# Patient Record
Sex: Male | Born: 1995
Health system: Southern US, Community
[De-identification: ages and names within clinical notes are randomized; demographics above are authoritative.]

## PROBLEM LIST (undated history)

## (undated) HISTORY — PX: OTHER SURGICAL HISTORY: SHX169

---
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
## 2017-01-02 NOTE — Progress Notes (Signed)
[place]  Telephone:(336) [phone] Fax:(336) [place] consult Note   Patient Care Team: Aura Dials, MD as PCP - General (Family Medicine) Jacelyn Pi, MD as Consulting Physician (Endocrinology) Truitt Merle, MD as Consulting Physician (Hematology) 01/03/2017  Referral physician: Dr. Chalmers Cater   CHIEF COMPLAINTS/PURPOSE OF CONSULTATION:  Polycythemia   HISTORY OF PRESENTING ILLNESS: 01/03/17 Cory Smith 21 y.o. male is here because of polycythemia. He was referred by Endocrinologist, Dr. Jacelyn Pi. He presents to the clinic today accompanied by his mother.   In the past, he has been having borderline mild hypothyroidism and is not on medication. He was diagnosed with Vitamin D Deficiency.His mother has hypothyroidism and it run in her family. He was born full term.   Today he notes he had a blood test about a year ago which shows he had high blood count by PCP Dr. Sheryn Bison. The most recent labs done by Dr.Balan in 10/2016. He had not had blood tests prior to being 20. He is now in college at [employer]. He notes he has felt tired since high school and does not have energy to do sports. He is able to do day to day activities. He will feel out of breast when going up and down stairs. His mother notes he has tachycardia and will see Cardiologist tomorrow. He may occasionally take protein supplement and take vitamin D. He thinks he has dry scalp and will itch in just his scalp. He notes he will wake up with mucous build up and will have to blow his nose every morning. He notes his weight is consistent lower. He denies fever or night sweats. He goes to the gym once a week and does cardio. His insurance is under his father. He has never donated blood before.   He was found to have abnormal CBC from 12/12/16 labs show polycythemia, RBC 5.78, Hgb: 17.5, HCT 51.8% He denies recent chest pain on exertion, shortness of breath on minimal exertion, pre-syncopal episodes,  or palpitations. He had not noticed any recent bleeding such as epistaxis, hematuria or hematochezia The patient denies over the counter NSAID ingestion. He is not on antiplatelets agents. He had no prior history or diagnosis of cancer. He denies any pica and eats a variety of diet. He never donated blood or received blood transfusion   MEDICAL HISTORY:  Past Medical History:  Diagnosis Date  . Twin birth, mate liveborn    full term    SURGICAL HISTORY: Past Surgical History:  Procedure Laterality Date  . No past surgical history      SOCIAL HISTORY: Social History   Socioeconomic History  . Marital status: Single    Spouse name: Not on file  . Number of children: Not on file  . Years of education: Not on file  . Highest education level: Not on file  Social Needs  . Financial resource strain: Not on file  . Food insecurity - worry: Not on file  . Food insecurity - inability: Not on file  . Transportation needs - medical: Not on file  . Transportation needs - non-medical: Not on file  Occupational History  . Occupation: Ship broker  Tobacco Use  . Smoking status: Never Smoker  . Smokeless tobacco: Never Used  Substance and Sexual Activity  . Alcohol use: No    Frequency: Never  . Drug use: No  . Sexual activity: Not on file  Other Topics Concern  . Not on file  Social History Narrative  HH of 4 ( has twin sister)   10th grade  [employer]   Has driving permit    no pets    Not ets.   Neg etoh. caffiene    At school [employer] .                FAMILY HISTORY: Family History  Problem Relation Age of Onset  . Hypothyroidism Mother     ALLERGIES:  has No Known Allergies.  MEDICATIONS:  No current outpatient medications on file.   No current facility-administered medications for this visit.     REVIEW OF SYSTEMS:   Constitutional: Denies fevers, chills or abnormal night sweats (+) fatigue (+) low weight  Eyes: Denies blurriness of vision, double  vision or watery eyes Ears, nose, mouth, throat, and face: Denies mucositis or sore throat Respiratory: Denies cough, dyspnea or wheezes (+) congestion  Cardiovascular: Denies palpitation, chest discomfort or lower extremity swelling  Gastrointestinal:  Denies nausea, heartburn or change in bowel habits Skin: Denies abnormal skin rashes (+) dry scalp, itchiness  Lymphatics: Denies new lymphadenopathy or easy bruising Neurological:Denies numbness, tingling or new weaknesses Behavioral/Psych: Mood is stable, no new changes  All other systems were reviewed with the patient and are negative.  PHYSICAL EXAMINATION: ECOG PERFORMANCE STATUS: 0 - Asymptomatic  Vitals:   01/03/17 1202  BP: 132/76  Pulse: (!) 105  Resp: 18  Temp: 99.2 F (37.3 C)  SpO2: 99%   Filed Weights   01/03/17 1202  Weight: 117 lb 14.4 oz (53.5 kg)    GENERAL:alert, no distress and comfortable SKIN: skin color, texture, turgor are normal, no rashes or significant lesions EYES: normal, conjunctiva are pink and non-injected, sclera clear OROPHARYNX:no exudate, no erythema and lips, buccal mucosa, and tongue normal  NECK: supple, thyroid normal size, non-tender, without nodularity LYMPH:  no palpable lymphadenopathy in the cervical, axillary or inguinal LUNGS: clear to auscultation and percussion with normal breathing effort HEART: regular rhythm and no murmurs and no lower extremity edema (+) tachycardia  ABDOMEN:abdomen soft, non-tender and normal bowel sounds Musculoskeletal:no cyanosis of digits and no clubbing  PSYCH: alert & oriented x 3 with fluent speech NEURO: no focal motor/sensory deficits  LABORATORY DATA:  I have reviewed the data as listed CBC Latest Ref Rng & Units 01/03/2017  WBC 4.0 - 10.3 10e3/uL 5.9  Hemoglobin 13.0 - 17.1 g/dL 16.6  Hematocrit 38.4 - 49.9 % 49.5  Platelets 140 - 400 10e3/uL 196    CMP Latest Ref Rng & Units 01/03/2017  Glucose 70 - 140 mg/dl 106  BUN 7.0 - 26.0 mg/dL  9.5  Creatinine 0.7 - 1.3 mg/dL 0.9  Sodium 136 - 145 mEq/L 140  Potassium 3.5 - 5.1 mEq/L 3.8  CO2 22 - 29 mEq/L 26  Calcium 8.4 - 10.4 mg/dL 9.4  Total Protein 6.4 - 8.3 g/dL 7.5  Total Bilirubin 0.20 - 1.20 mg/dL 0.46  Alkaline Phos 40 - 150 U/L 87  AST 5 - 34 U/L 25  ALT 0 - 55 U/L 33    12/12/16 Labs RBC 5.78 Hgb: 17.5 HCT 51.8%   RADIOGRAPHIC STUDIES: I have personally reviewed the radiological images as listed and agreed with the findings in the report. No results found.  ASSESSMENT & PLAN:  Cory Smith is a 21 y.o. male with a history of vitamin D Deficiency and borderline hypothyroidism.    1. Polycythemia -His first set of labs were in 2017 which showed elevated blood counts and but no  further workup was done, per pt, I do not have the actual report. He presented with continued elevated blood counts in 10/2016.  I have reviewed his outside lab. -This is suspicious for polycythemia. I do not see a secondary reason for this as he has no other chronic disease nor is he taking supplements or smokes. I will do a blood test to rule out polycythemia vera, including CBC with reticulocyte count, erythropoietin level, Jak 2 mutation (including exon 12 if V617F negative),  possible a bone marrow biopsy based on lab results.  -I will do a [place] of his spleen.  -I discussed the treatment options of phlebotomies and aspirin if he does have polycythemia vera. I do not recommend any treatment until we confirm the diagnosis. -Patient and his mother had many questions about the prognosis of polycythemia, I reassured them that this is a benign disease, he does not have high risk features, will likely do very well with phlebotomy.  There is only small possibility that PV may transform to leukemia or myelofibrosis after decades -if work up negative, this could be normal variant  -I encouraged him to exercise more and to drink more water daily.  -F/u in 5 weeks with repeated lab    PLAN:    -Labs today  -[place] of abdomen in 1-2 weeks  -I will call pt and his mother about the test results. If JAK2 mutation (+), will get a bone marrow biopsy  -Lab CBC and f/u in 5 weeks     No problem-specific Assessment & Plan notes found for this encounter.    All questions were answered. The patient knows to call the clinic with any problems, questions or concerns.  I spent 30 minutes counseling the patient face to face. The total time spent in the appointment was 45 minutes and more than 50% was on counseling.     Truitt Merle, MD 01/03/2017  11:08 PM  This document serves as a record of services personally performed by Truitt Merle, MD. It was created on her behalf by Joslyn Devon, a trained medical scribe. The creation of this record is based on the scribe's personal observations and the provider's statements to them.    I have reviewed the above documentation for accuracy and completeness, and I agree with the above.  Addendum Patient's parents return to my office after the visit, was very concerned about the diagnosis of polycythemia vera.  I stated that we do not have the definitive diagnosis yet, and I will call them as soon as I have the results back.  We again reviewed the natural history of PV, and I reassured them this is a very treatable disease.  Truitt Merle

## 2017-01-03 ENCOUNTER — Inpatient Hospital Stay: Payer: BLUE CROSS/BLUE SHIELD

## 2017-01-03 ENCOUNTER — Inpatient Hospital Stay: Payer: BLUE CROSS/BLUE SHIELD | Attending: Hematology | Admitting: Hematology

## 2017-01-03 ENCOUNTER — Ambulatory Visit (HOSPITAL_BASED_OUTPATIENT_CLINIC_OR_DEPARTMENT_OTHER): Payer: BLUE CROSS/BLUE SHIELD

## 2017-01-03 ENCOUNTER — Encounter: Payer: Self-pay | Admitting: Hematology

## 2017-01-03 ENCOUNTER — Telehealth: Payer: Self-pay | Admitting: Hematology

## 2017-01-03 DIAGNOSIS — E559 Vitamin D deficiency, unspecified: Secondary | ICD-10-CM | POA: Diagnosis not present

## 2017-01-03 DIAGNOSIS — R799 Abnormal finding of blood chemistry, unspecified: Secondary | ICD-10-CM | POA: Diagnosis not present

## 2017-01-03 DIAGNOSIS — D45 Polycythemia vera: Secondary | ICD-10-CM

## 2017-01-03 DIAGNOSIS — R828 Abnormal findings on cytological and histological examination of urine: Secondary | ICD-10-CM

## 2017-01-03 DIAGNOSIS — R769 Abnormal immunological finding in serum, unspecified: Secondary | ICD-10-CM

## 2017-01-03 LAB — COMPREHENSIVE METABOLIC PANEL
ALT: 33 U/L (ref 0–55)
ANION GAP: 9 meq/L (ref 3–11)
AST: 25 U/L (ref 5–34)
Albumin: 4.8 g/dL (ref 3.5–5.0)
Alkaline Phosphatase: 87 U/L (ref 40–150)
BILIRUBIN TOTAL: 0.46 mg/dL (ref 0.20–1.20)
BUN: 9.5 mg/dL (ref 7.0–26.0)
CALCIUM: 9.4 mg/dL (ref 8.4–10.4)
CHLORIDE: 105 meq/L (ref 98–109)
CO2: 26 mEq/L (ref 22–29)
CREATININE: 0.9 mg/dL (ref 0.7–1.3)
EGFR: >60 mL/min/{1.73_m2} (ref >60)
Glucose: 106 mg/dl (ref 70–140)
Potassium: 3.8 mEq/L (ref 3.5–5.1)
Sodium: 140 mEq/L (ref 136–145)
TOTAL PROTEIN: 7.5 g/dL (ref 6.4–8.3)

## 2017-01-03 LAB — CBC & DIFF AND RETIC
BASO%: 0.2 % (ref 0.0–2.0)
BASOS ABS: 0 10*3/uL (ref 0.0–0.1)
EOS ABS: 0 10*3/uL (ref 0.0–0.5)
EOS%: 0.7 % (ref 0.0–7.0)
HEMATOCRIT: 49.5 % (ref 38.4–49.9)
HEMOGLOBIN: 16.6 g/dL (ref 13.0–17.1)
Immature Retic Fract: 0.6 % — ABNORMAL LOW (ref 3.00–10.60)
LYMPH%: 22.8 % (ref 14.0–49.0)
MCH: 30.8 pg (ref 27.2–33.4)
MCHC: 33.5 g/dL (ref 32.0–36.0)
MCV: 91.8 fL (ref 79.3–98.0)
MONO#: 0.1 10*3/uL (ref 0.1–0.9)
MONO%: 0.8 % (ref 0.0–14.0)
NEUT%: 75.5 % — ABNORMAL HIGH (ref 39.0–75.0)
NEUTROS ABS: 4.5 10*3/uL (ref 1.5–6.5)
Platelets: 196 10*3/uL (ref 140–400)
RBC: 5.39 10*6/uL (ref 4.20–5.82)
RDW: 12.2 % (ref 11.0–14.6)
RETIC %: 0.74 % — AB (ref 0.80–1.80)
Retic Ct Abs: 39.89 10*3/uL (ref 34.80–93.90)
WBC: 5.9 10*3/uL (ref 4.0–10.3)
lymph#: 1.4 10*3/uL (ref 0.9–3.3)

## 2017-01-03 LAB — MORPHOLOGY
PLT EST: ADEQUATE
RBC Comments: NORMAL

## 2017-01-03 NOTE — Telephone Encounter (Signed)
Gave avs and calendar for January 2019 °

## 2017-01-04 LAB — ERYTHROPOIETIN: Erythropoietin: 8.7 m[IU]/mL (ref 2.6–18.5)

## 2017-01-05 ENCOUNTER — Encounter: Payer: Self-pay | Admitting: Hematology

## 2017-01-10 ENCOUNTER — Telehealth: Payer: Self-pay | Admitting: Hematology

## 2017-01-10 ENCOUNTER — Other Ambulatory Visit: Payer: Self-pay | Admitting: Hematology

## 2017-01-10 DIAGNOSIS — D751 Secondary polycythemia: Secondary | ICD-10-CM

## 2017-01-10 NOTE — Telephone Encounter (Signed)
I called pt's mother and reviewed his lab test results.  JAK2 V6 17 F mutation and exon 12 mutation was negative, erythropoietin level was not normal, his hematocrit was 49.5% last time.  I do not think he has polycythemia.  I encouraged him to drink more water.  His abdominal ultrasound still pending, and I will see him back in early January.  Patient's mother voiced a good understanding, and appreciated call.

## 2017-01-31 ENCOUNTER — Telehealth: Payer: Self-pay | Admitting: Hematology

## 2017-01-31 ENCOUNTER — Ambulatory Visit (HOSPITAL_COMMUNITY): Payer: BLUE CROSS/BLUE SHIELD

## 2017-01-31 ENCOUNTER — Telehealth: Payer: Self-pay | Admitting: *Deleted

## 2017-01-31 NOTE — Telephone Encounter (Signed)
Received call from mother Cory Smith to r/s Jan 2 appt to later in month.  Schedule message sent to r/s & call mother.

## 2017-01-31 NOTE — Telephone Encounter (Signed)
Spoke to patients mother regarding upcoming January appointments.

## 2017-02-07 ENCOUNTER — Ambulatory Visit: Payer: BLUE CROSS/BLUE SHIELD | Admitting: Hematology

## 2017-02-07 ENCOUNTER — Other Ambulatory Visit: Payer: BLUE CROSS/BLUE SHIELD

## 2017-02-23 ENCOUNTER — Ambulatory Visit (HOSPITAL_COMMUNITY): Payer: BLUE CROSS/BLUE SHIELD

## 2017-02-27 NOTE — Progress Notes (Signed)
[place]  Telephone:(336) [phone] Fax:(336) [phone]  Clinic Follow Up Note   Patient Care Team: Aura Dials, MD as PCP - General (Family Medicine) Jacelyn Pi, MD as Consulting Physician (Endocrinology) Truitt Merle, MD as Consulting Physician (Hematology)   Date of Service:  03/01/2017   CHIEF COMPLAINTS:  Follow up polycythemia   HISTORY OF PRESENTING ILLNESS: 01/03/17 Cory Smith 22 y.o. male is here because of polycythemia. He was referred by Endocrinologist, Dr. Jacelyn Pi. He presents to the clinic today accompanied by his mother.   In the past, he has been having borderline mild hypothyroidism and is not on medication. He was diagnosed with Vitamin D Deficiency.His mother has hypothyroidism and it run in her family. He was born full term.   Today he notes he had a blood test about a year ago which shows he had high blood count by PCP Dr. Sheryn Bison. The most recent labs done by Dr.Balan in 10/2016. He had not had blood tests prior to being 20. He is now in college at [employer]. He notes he has felt tired since high school and does not have energy to do sports. He is able to do day to day activities. He will feel out of breast when going up and down stairs. His mother notes he has tachycardia and will see Cardiologist tomorrow. He may occasionally take protein supplement and take vitamin D. He thinks he has dry scalp and will itch in just his scalp. He notes he will wake up with mucous build up and will have to blow his nose every morning. He notes his weight is consistent lower. He denies fever or night sweats. He goes to the gym once a week and does cardio. His insurance is under his father. He has never donated blood before.   He was found to have abnormal CBC from 12/12/16 labs show polycythemia, RBC 5.78, Hgb: 17.5, HCT 51.8% He denies recent chest pain on exertion, shortness of breath on minimal exertion, pre-syncopal episodes, or palpitations. He had not  noticed any recent bleeding such as epistaxis, hematuria or hematochezia The patient denies over the counter NSAID ingestion. He is not on antiplatelets agents. He had no prior history or diagnosis of cancer. He denies any pica and eats a variety of diet. He never donated blood or received blood transfusion   INTERVAL HISTORY  Cory Smith is here for a follow up. He presents to the clinic today accompanied by his mother.  He notes he still has fatigue mostly in the morning. He went to see a cardiologist and work up returned normal. He is scheduled for abdominal US in 03/2017 to monitor spleen.    On review of symptoms, pt  continued fatigue. He notes feeling numbness down his right arm on and off. He will experience loss of sensation. He is left handed and does not sleep on one side. He feels his right shoulder is slightly down compared to his other.    MEDICAL HISTORY:  Past Medical History:  Diagnosis Date  . Twin birth, mate liveborn    full term    SURGICAL HISTORY: Past Surgical History:  Procedure Laterality Date  . No past surgical history      SOCIAL HISTORY: Social History   Socioeconomic History  . Marital status: Single    Spouse name: Not on file  . Number of children: Not on file  . Years of education: Not on file  . Highest education level: Not on file  Social Needs  . Financial resource strain: Not on file  . Food insecurity - worry: Not on file  . Food insecurity - inability: Not on file  . Transportation needs - medical: Not on file  . Transportation needs - non-medical: Not on file  Occupational History  . Occupation: Ship broker  Tobacco Use  . Smoking status: Never Smoker  . Smokeless tobacco: Never Used  Substance and Sexual Activity  . Alcohol use: No    Frequency: Never  . Drug use: No  . Sexual activity: Not on file  Other Topics Concern  . Not on file  Social History Narrative   HH of 4 ( has twin sister)   10th grade  [employer]    Has driving permit    no pets    Not ets.   Neg etoh. caffiene    At school [employer] .                FAMILY HISTORY: Family History  Problem Relation Age of Onset  . Hypothyroidism Mother     ALLERGIES:  has No Known Allergies.  MEDICATIONS:  No current outpatient medications on file.   No current facility-administered medications for this visit.     REVIEW OF SYSTEMS:   Constitutional: Denies fevers, chills or abnormal night sweats (+) fatigue (+) low weight  Eyes: Denies blurriness of vision, double vision or watery eyes Ears, nose, mouth, throat, and face: Denies mucositis or sore throat Respiratory: Denies cough, dyspnea or wheezes  Cardiovascular: Denies palpitation, chest discomfort or lower extremity swelling  Gastrointestinal:  Denies nausea, heartburn or change in bowel habits Skin: Denies abnormal skin rashes  Lymphatics: Denies new lymphadenopathy or easy bruising Neurological:Denies tingling or new weaknesses (+) occasional loss of sensation in right arm  Behavioral/Psych: Mood is stable, no new changes  All other systems were reviewed with the patient and are negative.  PHYSICAL EXAMINATION: ECOG PERFORMANCE STATUS: 0 - Asymptomatic  Vitals:   03/01/17 0842  BP: 132/87  Pulse: 87  Resp: 20  Temp: 99.1 F (37.3 C)  SpO2: 99%   Filed Weights   03/01/17 0842  Weight: 116 lb 8 oz (52.8 kg)    GENERAL:alert, no distress and comfortable SKIN: skin color, texture, turgor are normal, no rashes or significant lesions EYES: normal, conjunctiva are pink and non-injected, sclera clear OROPHARYNX:no exudate, no erythema and lips, buccal mucosa, and tongue normal  NECK: supple, thyroid normal size, non-tender, without nodularity LYMPH:  no palpable lymphadenopathy in the cervical, axillary or inguinal LUNGS: clear to auscultation and percussion with normal breathing effort HEART: regular rhythm and no murmurs and no lower extremity edema (+) tachycardia    ABDOMEN:abdomen soft, non-tender and normal bowel sounds Musculoskeletal:no cyanosis of digits and no clubbing  PSYCH: alert & oriented x 3 with fluent speech NEURO: no focal motor/sensory deficits  LABORATORY DATA:  I have reviewed the data as listed CBC Latest Ref Rng & Units 03/01/2017 01/03/2017  WBC 4.0 - 10.3 K/uL 5.9 5.9  Hemoglobin 13.0 - 17.1 g/dL - 16.6  Hematocrit 38.4 - 49.9 % 50.4(H) 49.5  Platelets 140 - 400 K/uL 208 196    CMP Latest Ref Rng & Units 01/03/2017  Glucose 70 - 140 mg/dl 106  BUN 7.0 - 26.0 mg/dL 9.5  Creatinine 0.7 - 1.3 mg/dL 0.9  Sodium 136 - 145 mEq/L 140  Potassium 3.5 - 5.1 mEq/L 3.8  CO2 22 - 29 mEq/L 26  Calcium 8.4 - 10.4 mg/dL 9.4  Total Protein 6.4 - 8.3 g/dL 7.5  Total Bilirubin 0.20 - 1.20 mg/dL 0.46  Alkaline Phos 40 - 150 U/L 87  AST 5 - 34 U/L 25  ALT 0 - 55 U/L 33    12/12/16 Outside Labs: RBC 5.78 Hgb: 17.5 HCT 51.8%   RADIOGRAPHIC STUDIES: I have personally reviewed the radiological images as listed and agreed with the findings in the report. No results found.  ASSESSMENT & PLAN:  MCCORMICK MACON is a 22 y.o. male with a history of vitamin D Deficiency and borderline hypothyroidism.    1. Erythrocytosis  -His first set of labs were in 2017 which showed elevated blood counts and but no further workup was done, per pt, I do not have the actual report. He presented with continued elevated RBC blood counts in 10/2016.  I have reviewed his outside lab. -His lab work showed that JAK2 V6 10 F mutation and exon 12 mutation was negative, erythropoietin level was normal, his hematocrit was 49.5% last time, 50% today. I do not think he has polycythemia vera or secondary polycythemia.  I think this could be normal variant.  -His abdominal US is still pending. Set for 03/29/2017. I discussed his previous exam did not show splenomegaly and further lab workup does not require a abdominal US at this time. He is fine to cancel [place].  -I  discussed he does not need therapeutic phlebotomies given his HCT levels are only mildly elevated.  -I suggest he occasionally donate blood 0.5-1 unit. I encourage him to drink plenty of water. I also encourage him to follow up with his PCP at least once a year to monitor his blood counts. He no longer needs to follow me unless his HCT increases significantly. He is agreeable.  -I will see him as needed  2. Fatigue  -f/u with PCP, I encourage him to exercise   PLAN:  -lab results reviewed. Will cancel his abdominal US. F/u as needed in future.    No problem-specific Assessment & Plan notes found for this encounter.    All questions were answered. The patient knows to call the clinic with any problems, questions or concerns.  I spent 15 minutes counseling the patient face to face. The total time spent in the appointment was 20 minutes and more than 50% was on counseling.     Truitt Merle, MD 03/01/2017    This document serves as a record of services personally performed by Truitt Merle, MD. It was created on her behalf by Joslyn Devon, a trained medical scribe. The creation of this record is based on the scribe's personal observations and the provider's statements to them.    I have reviewed the above documentation for accuracy and completeness, and I agree with the above.

## 2017-03-01 ENCOUNTER — Inpatient Hospital Stay: Payer: BLUE CROSS/BLUE SHIELD | Attending: Hematology

## 2017-03-01 ENCOUNTER — Inpatient Hospital Stay (HOSPITAL_BASED_OUTPATIENT_CLINIC_OR_DEPARTMENT_OTHER): Payer: BLUE CROSS/BLUE SHIELD | Admitting: Hematology

## 2017-03-01 ENCOUNTER — Encounter: Payer: Self-pay | Admitting: Hematology

## 2017-03-01 VITALS — BP 132/87 | HR 87 | Temp 99.1°F | Resp 20 | Ht 67.0 in | Wt 116.5 lb

## 2017-03-01 DIAGNOSIS — R200 Anesthesia of skin: Secondary | ICD-10-CM | POA: Insufficient documentation

## 2017-03-01 DIAGNOSIS — E559 Vitamin D deficiency, unspecified: Secondary | ICD-10-CM | POA: Diagnosis not present

## 2017-03-01 DIAGNOSIS — D751 Secondary polycythemia: Secondary | ICD-10-CM | POA: Insufficient documentation

## 2017-03-01 DIAGNOSIS — E039 Hypothyroidism, unspecified: Secondary | ICD-10-CM

## 2017-03-01 DIAGNOSIS — R5383 Other fatigue: Secondary | ICD-10-CM | POA: Insufficient documentation

## 2017-03-01 LAB — CBC WITH DIFFERENTIAL (CANCER CENTER ONLY)
BASOS ABS: 0 10*3/uL (ref 0.0–0.1)
Basophils Relative: 0 %
Eosinophils Absolute: 0.2 10*3/uL (ref 0.0–0.5)
Eosinophils Relative: 3 %
HEMATOCRIT: 50.4 % — AB (ref 38.4–49.9)
HEMOGLOBIN: 17.1 g/dL (ref 13.0–17.1)
Lymphocytes Relative: 40 %
Lymphs Abs: 2.4 10*3/uL (ref 0.9–3.3)
MCH: 31.4 pg (ref 27.2–33.4)
MCHC: 33.9 g/dL (ref 32.0–36.0)
MCV: 92.5 fL (ref 79.3–98.0)
MONOS PCT: 8 %
Monocytes Absolute: 0.5 10*3/uL (ref 0.1–0.9)
NEUTROS ABS: 2.9 10*3/uL (ref 1.5–6.5)
NEUTROS PCT: 49 %
Platelet Count: 208 10*3/uL (ref 140–400)
RBC: 5.45 MIL/uL (ref 4.20–5.82)
RDW: 12.4 % (ref 11.0–15.6)
WBC: 5.9 10*3/uL (ref 4.0–10.3)

## 2017-03-01 LAB — RETICULOCYTES
RBC.: 5.45 MIL/uL (ref 4.20–5.82)
RETIC COUNT ABSOLUTE: 49.1 10*3/uL (ref 34.8–93.9)
Retic Ct Pct: 0.9 % (ref 0.8–1.8)

## 2017-03-06 ENCOUNTER — Ambulatory Visit (HOSPITAL_COMMUNITY): Payer: BLUE CROSS/BLUE SHIELD

## 2017-03-29 ENCOUNTER — Ambulatory Visit (HOSPITAL_COMMUNITY): Payer: BLUE CROSS/BLUE SHIELD

## 2019-04-11 ENCOUNTER — Other Ambulatory Visit: Payer: Self-pay

## 2019-04-11 ENCOUNTER — Ambulatory Visit: Payer: Medicaid Other | Attending: Internal Medicine

## 2019-04-11 DIAGNOSIS — Z23 Encounter for immunization: Secondary | ICD-10-CM | POA: Insufficient documentation

## 2019-04-11 NOTE — Progress Notes (Signed)
   Covid-19 Vaccination Clinic  Name:  [name]    MRN: [id number] DOB: [birth year]  04/11/2019  Mr. Buehrer was observed post Covid-19 immunization for 15 minutes without incident. He was provided with Vaccine Information Sheet and instruction to access the V-Safe system.   Mr. Branco was instructed to call 911 with any severe reactions post vaccine: Marland Kitchen Difficulty breathing  . Swelling of face and throat  . A fast heartbeat  . A bad rash all over body  . Dizziness and weakness

## 2019-05-09 ENCOUNTER — Ambulatory Visit: Payer: Medicaid Other | Attending: Internal Medicine

## 2019-05-09 DIAGNOSIS — Z23 Encounter for immunization: Secondary | ICD-10-CM

## 2019-05-09 NOTE — Progress Notes (Signed)
   Covid-19 Vaccination Clinic  Name:  Cory Smith    MRN: [id number] DOB: 1996-01-25  05/09/2019  Cory Smith was observed post Covid-19 immunization for 15 minutes without incident. He was provided with Vaccine Information Sheet and instruction to access the V-Safe system.   Cory Smith was instructed to call 911 with any severe reactions post vaccine: Marland Kitchen Difficulty breathing  . Swelling of face and throat  . A fast heartbeat  . A bad rash all over body  . Dizziness and weakness   Immunizations Administered    Name Date Dose VIS Date Route   Pfizer COVID-19 Vaccine 05/09/2019 10:20 AM 0.3 mL 01/17/2019 Intramuscular   Manufacturer: [employer], [employer]   Lot: [id number]   [place]: [id number]

## 2019-05-13 ENCOUNTER — Ambulatory Visit: Payer: Medicaid Other
